# Patient Record
Sex: Female | Born: 1960 | Race: White | Hispanic: No | Marital: Married | Smoking: Never smoker
Health system: Southern US, Community
[De-identification: ages and names within clinical notes are randomized; demographics above are authoritative.]

## PROBLEM LIST (undated history)

## (undated) DIAGNOSIS — R232 Flushing: Secondary | ICD-10-CM

## (undated) DIAGNOSIS — R002 Palpitations: Secondary | ICD-10-CM

## (undated) DIAGNOSIS — M1990 Unspecified osteoarthritis, unspecified site: Secondary | ICD-10-CM

## (undated) HISTORY — DX: Flushing: R23.2

## (undated) HISTORY — DX: Unspecified osteoarthritis, unspecified site: M19.90

## (undated) HISTORY — PX: DILATION AND CURETTAGE OF UTERUS: SHX78

## (undated) HISTORY — DX: Palpitations: R00.2

## (undated) HISTORY — PX: OTHER SURGICAL HISTORY: SHX169

---

## 1999-07-24 ENCOUNTER — Encounter: Payer: Self-pay | Admitting: Obstetrics and Gynecology

## 1999-07-24 ENCOUNTER — Encounter: Admission: RE | Admit: 1999-07-24 | Discharge: 1999-07-24 | Payer: Self-pay | Admitting: Obstetrics and Gynecology

## 2000-07-30 ENCOUNTER — Encounter: Admission: RE | Admit: 2000-07-30 | Discharge: 2000-07-30 | Payer: Self-pay | Admitting: Obstetrics and Gynecology

## 2000-07-30 ENCOUNTER — Encounter: Payer: Self-pay | Admitting: Obstetrics and Gynecology

## 2001-08-19 ENCOUNTER — Encounter: Payer: Self-pay | Admitting: Obstetrics and Gynecology

## 2001-08-19 ENCOUNTER — Encounter: Admission: RE | Admit: 2001-08-19 | Discharge: 2001-08-19 | Payer: Self-pay | Admitting: Obstetrics and Gynecology

## 2002-08-21 ENCOUNTER — Encounter: Payer: Self-pay | Admitting: Obstetrics and Gynecology

## 2002-08-21 ENCOUNTER — Encounter: Admission: RE | Admit: 2002-08-21 | Discharge: 2002-08-21 | Payer: Self-pay | Admitting: Obstetrics and Gynecology

## 2004-06-30 ENCOUNTER — Encounter: Admission: RE | Admit: 2004-06-30 | Discharge: 2004-06-30 | Payer: Self-pay | Admitting: Unknown Physician Specialty

## 2005-09-06 ENCOUNTER — Encounter: Admission: RE | Admit: 2005-09-06 | Discharge: 2005-09-06 | Payer: Self-pay | Admitting: Unknown Physician Specialty

## 2006-04-23 ENCOUNTER — Ambulatory Visit (HOSPITAL_BASED_OUTPATIENT_CLINIC_OR_DEPARTMENT_OTHER): Admission: RE | Admit: 2006-04-23 | Discharge: 2006-04-23 | Payer: Self-pay | Admitting: Orthopaedic Surgery

## 2006-11-13 ENCOUNTER — Encounter: Admission: RE | Admit: 2006-11-13 | Discharge: 2006-11-13 | Payer: Self-pay | Admitting: Unknown Physician Specialty

## 2007-06-02 ENCOUNTER — Ambulatory Visit (HOSPITAL_COMMUNITY): Admission: RE | Admit: 2007-06-02 | Discharge: 2007-06-02 | Payer: Self-pay | Admitting: Optometry

## 2008-03-15 ENCOUNTER — Encounter: Admission: RE | Admit: 2008-03-15 | Discharge: 2008-03-15 | Payer: Self-pay | Admitting: Unknown Physician Specialty

## 2008-03-22 ENCOUNTER — Encounter: Admission: RE | Admit: 2008-03-22 | Discharge: 2008-03-22 | Payer: Self-pay | Admitting: Unknown Physician Specialty

## 2008-10-04 ENCOUNTER — Encounter: Admission: RE | Admit: 2008-10-04 | Discharge: 2008-10-04 | Payer: Self-pay | Admitting: Unknown Physician Specialty

## 2009-03-29 ENCOUNTER — Encounter: Admission: RE | Admit: 2009-03-29 | Discharge: 2009-03-29 | Payer: Self-pay | Admitting: Unknown Physician Specialty

## 2009-08-02 IMAGING — MG MM SCREEN MAMMOGRAM BILATERAL
4 series · 4 of 4 positions shown · non-contrast
Comparison: none

DG SCREEN MAMMOGRAM BILATERAL
Bilateral CC and MLO view(s) were taken.
Technologist: Gitte Juul Zawadi

DIGITAL SCREENING MAMMOGRAM WITH CAD:
There are scattered fibroglandular densities.  A possible mass is noted in the right breast.  Spot 
compression views and possibly sonography are recommended for further evaluation.  In the left 
breast, no masses or malignant type calcifications are identified.  Compared with prior studies.

[R CC]
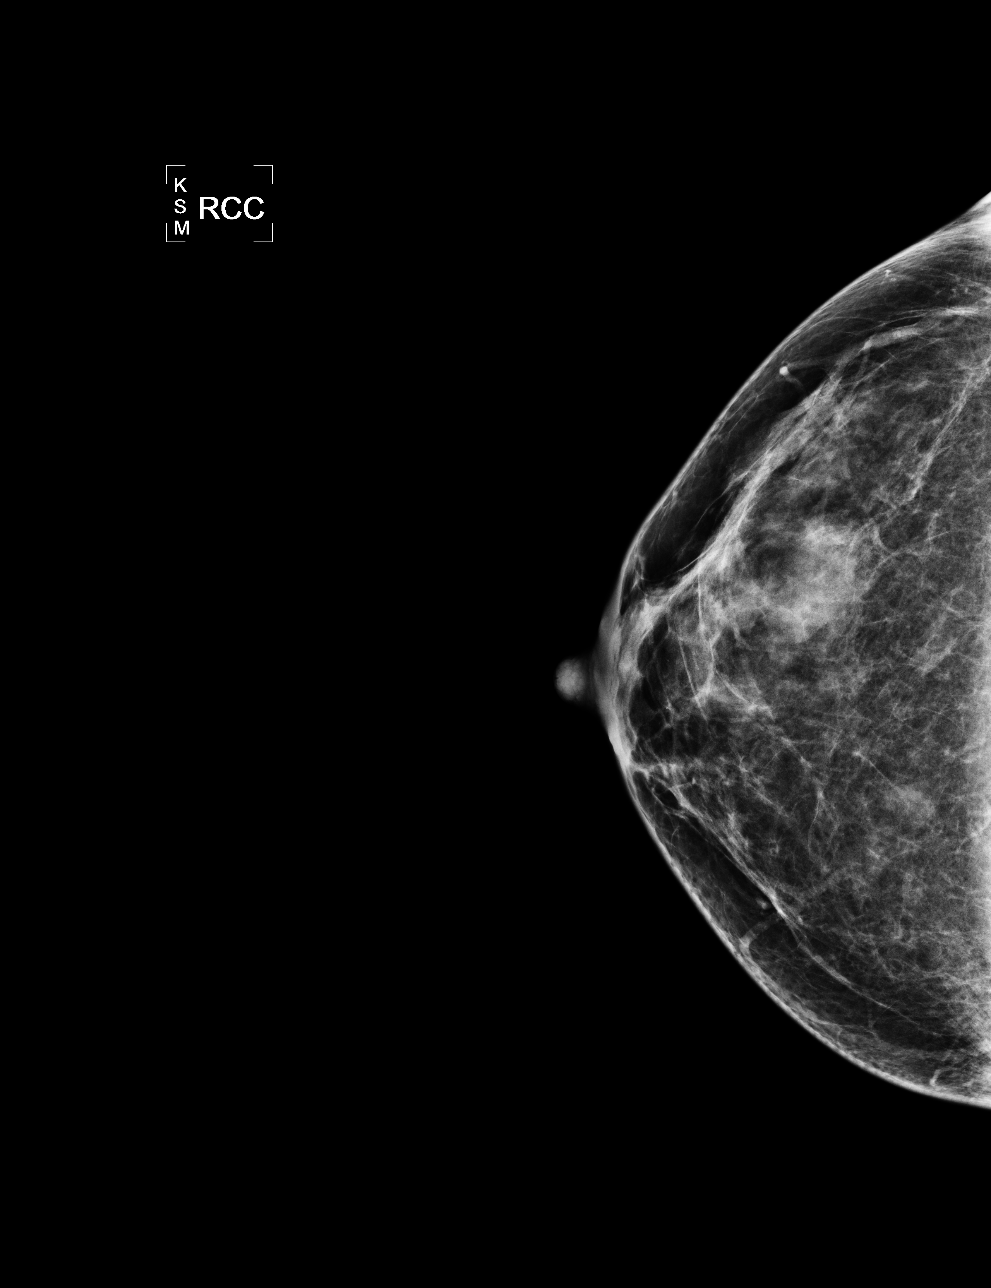

[L CC]
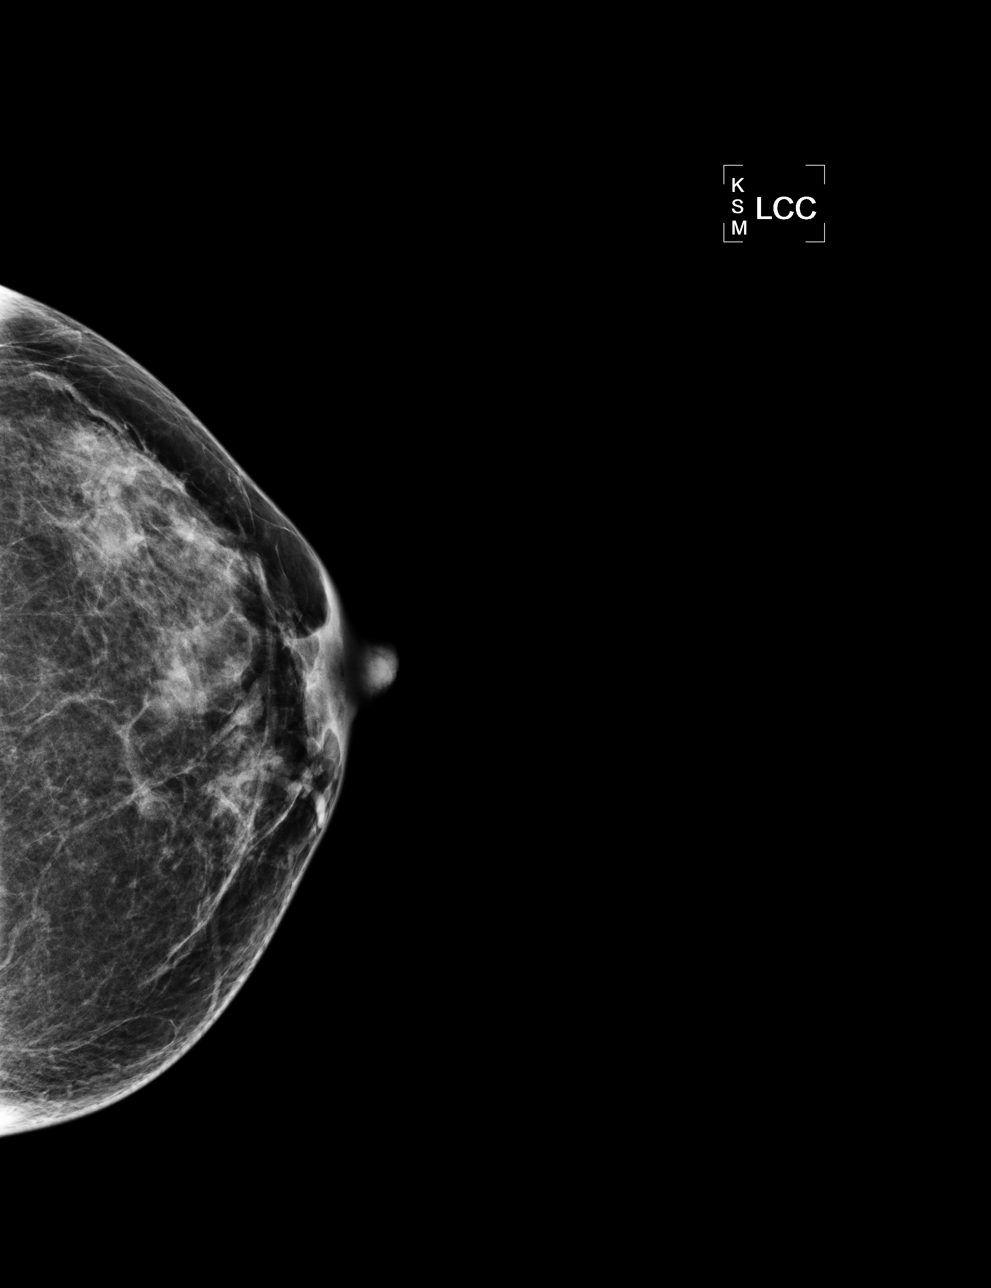

[L MLO]
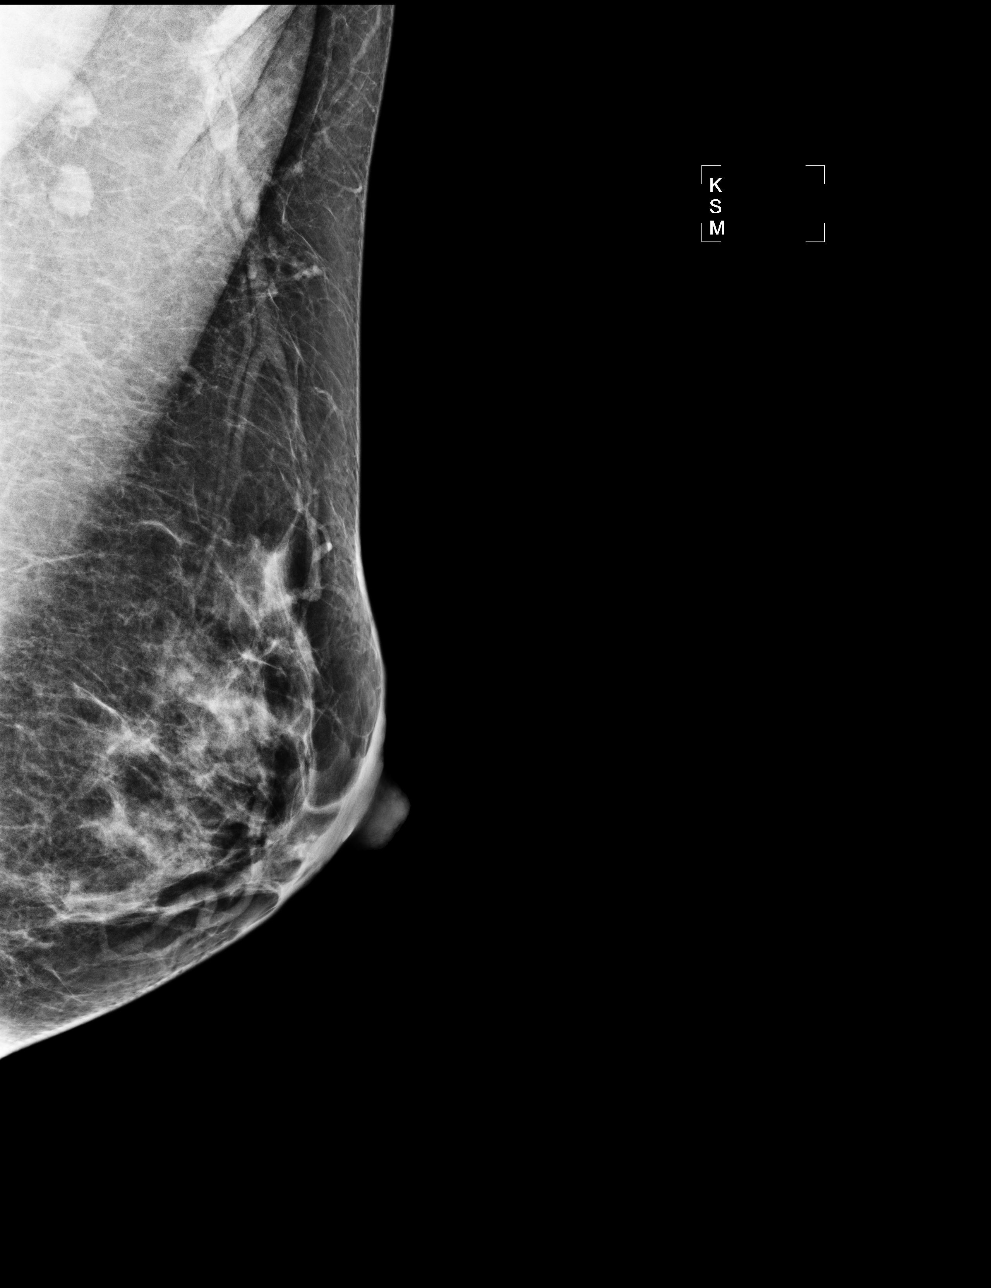

[R MLO]
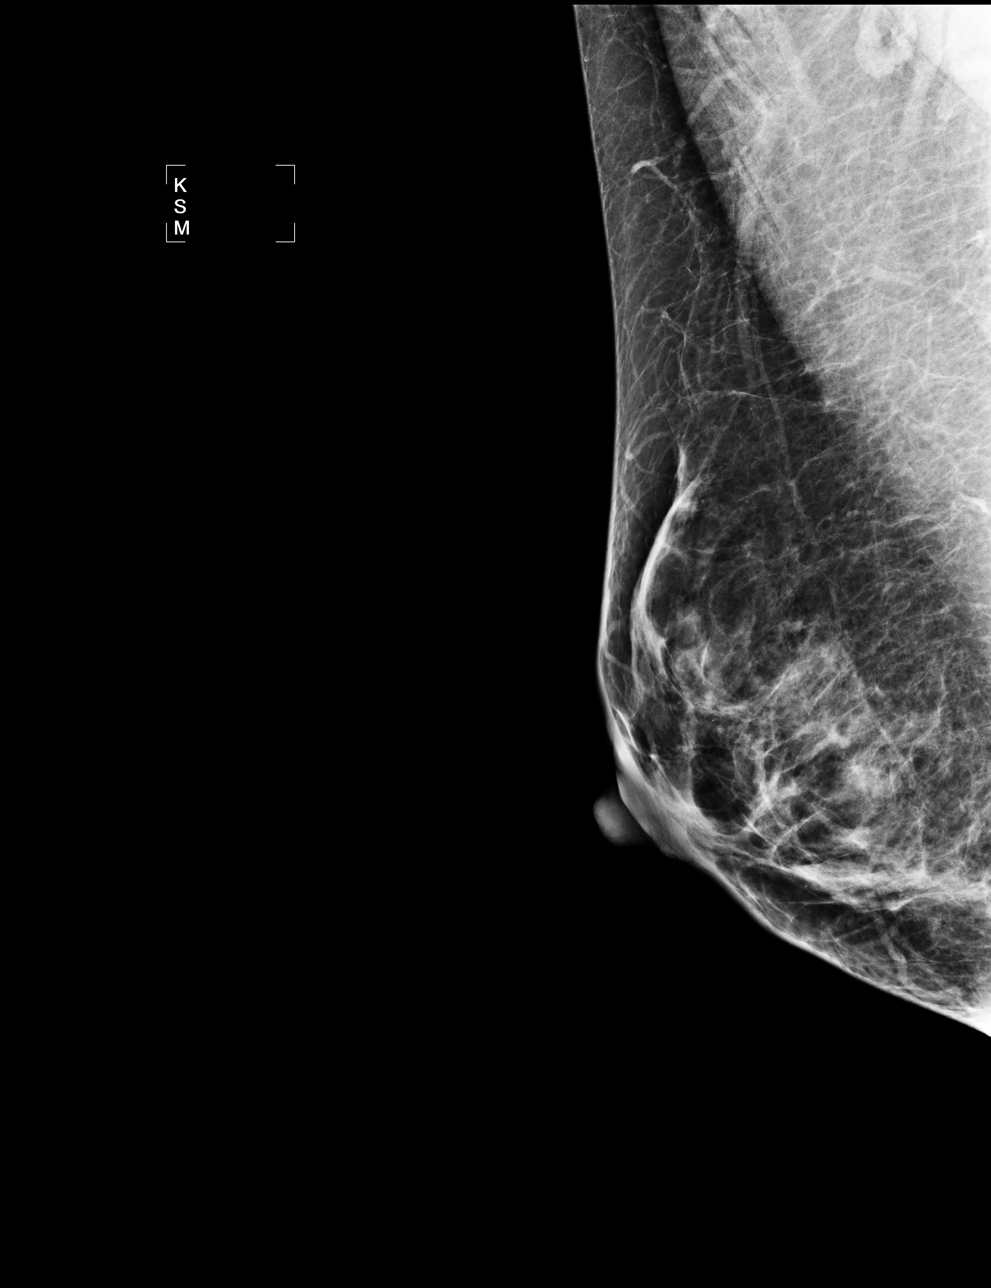

[4 of 4 positions shown; findings below may reference images not displayed]

IMPRESSION: Possible mass, right breast.  Additional evaluation is indicated.  The patient will be contacted 
for additional studies and a supplementary report will follow.  No specific mammographic evidence 
of malignancy, left breast.

ASSESSMENT: Need additional imaging evaluation and/or prior mammograms for comparison - BI-RADS 0

Further imaging of the right breast.
ANALYZED BY COMPUTER AIDED DETECTION. , THIS PROCEDURE WAS A DIGITAL MAMMOGRAM.

## 2010-05-19 ENCOUNTER — Encounter: Admission: RE | Admit: 2010-05-19 | Discharge: 2010-05-19 | Payer: Self-pay | Admitting: Unknown Physician Specialty

## 2010-07-20 ENCOUNTER — Encounter: Payer: Self-pay | Admitting: Internal Medicine

## 2010-08-07 DIAGNOSIS — D229 Melanocytic nevi, unspecified: Secondary | ICD-10-CM

## 2010-08-07 HISTORY — DX: Melanocytic nevi, unspecified: D22.9

## 2010-08-16 ENCOUNTER — Encounter (INDEPENDENT_AMBULATORY_CARE_PROVIDER_SITE_OTHER): Payer: Self-pay | Admitting: *Deleted

## 2010-08-18 ENCOUNTER — Ambulatory Visit: Payer: Self-pay | Admitting: Internal Medicine

## 2010-09-01 ENCOUNTER — Ambulatory Visit: Payer: Self-pay | Admitting: Internal Medicine

## 2010-09-25 ENCOUNTER — Encounter: Payer: Self-pay | Admitting: Optometry

## 2010-09-25 ENCOUNTER — Encounter: Payer: Self-pay | Admitting: Unknown Physician Specialty

## 2010-10-03 NOTE — Letter (Signed)
Summary: Pre Visit Letter Revised  [facility]  [address] [place], [place] [address]   Phone: [phone]  Fax: [phone]        07/20/2010 MRN: [id number]  Mariah Patel [address] [place], [place]  [address]             Procedure Date:  12-30 10am           Dr Marina Goodell   Welcome to the Gastroenterology Division at [facility].    You are scheduled to see a nurse for your pre-procedure visit on 08-18-10 at 10am on the 3rd floor at [facility], [address]  We ask that you try to arrive at our office 15 minutes prior to your appointment time to allow for check-in.  Please take a minute to review the attached form.  If you answer "Yes" to one or more of the questions on the first page, we ask that you call the person listed at your earliest opportunity.  If you answer "No" to all of the questions, please complete the rest of the form and bring it to your appointment.    Your nurse visit will consist of discussing your medical and surgical history, your immediate family medical history, and your medications.   If you are unable to list all of your medications on the form, please bring the medication bottles to your appointment and we will list them.  We will need to be aware of both prescribed and over the counter drugs.  We will need to know exact dosage information as well.    Please be prepared to read and sign documents such as consent forms, a financial agreement, and acknowledgement forms.  If necessary, and with your consent, a friend or relative is welcome to sit-in on the nurse visit with you.  Please bring your insurance card so that we may make a copy of it.  If your insurance requires a referral to see a specialist, please bring your referral form from your primary care physician.  No co-pay is required for this nurse visit.     If you cannot keep your appointment, please call [phone] to cancel or reschedule prior to your appointment date.   This allows [place] the opportunity to schedule an appointment for another patient in need of care.    Thank you for choosing [facility] for your medical needs.  We appreciate the opportunity to care for you.  Please visit [place] at our website  to learn more about our practice.  Sincerely, The Gastroenterology Division

## 2010-10-05 NOTE — Letter (Signed)
Summary: [facility] Instructions  [facility]  [address] [place], [place] [address]   Phone: [phone]  Fax: [phone]       Mariah Patel    Jan 09, 1961    MRN: [id number]        Procedure Day Dorna Bloom:  Farrell Ours  09/01/10     Arrival Time:  9:00AM     Procedure Time:  10:00AM     Location of Procedure:                    Juliann Pares _  [facility] (4th Floor)                      PREPARATION FOR COLONOSCOPY WITH MOVIPREP   Starting 5 days prior to your procedure 08/27/10 do not eat nuts, seeds, popcorn, corn, beans, peas,  salads, or any raw vegetables.  Do not take any fiber supplements (e.g. Metamucil, Citrucel, and Benefiber).  THE DAY BEFORE YOUR PROCEDURE         DATE: 08/31/10  DAY: THURSDAY  1.  Drink clear liquids the entire day-NO SOLID FOOD  2.  Do not drink anything colored red or purple.  Avoid juices with pulp.  No orange juice.  3.  Drink at least 64 oz. (8 glasses) of fluid/clear liquids during the day to prevent dehydration and help the prep work efficiently.  CLEAR LIQUIDS INCLUDE: Water Jello Ice Popsicles Tea (sugar ok, no milk/cream) Powdered fruit flavored drinks Coffee (sugar ok, no milk/cream) Gatorade Juice: apple, white grape, white cranberry  Lemonade Clear bullion, consomm, broth Carbonated beverages (any kind) Strained chicken noodle soup Hard Candy                             4.  In the morning, mix first dose of MoviPrep solution:    Empty 1 Pouch A and 1 Pouch B into the disposable container    Add lukewarm drinking water to the top line of the container. Mix to dissolve    Refrigerate (mixed solution should be used within 24 hrs)  5.  Begin drinking the prep at 5:00 p.m. The MoviPrep container is divided by 4 marks.   Every 15 minutes drink the solution down to the next mark (approximately 8 oz) until the full liter is complete.   6.  Follow completed prep with 16 oz of clear liquid of your choice (Nothing  red or purple).  Continue to drink clear liquids until bedtime.  7.  Before going to bed, mix second dose of MoviPrep solution:    Empty 1 Pouch A and 1 Pouch B into the disposable container    Add lukewarm drinking water to the top line of the container. Mix to dissolve    Refrigerate  THE DAY OF YOUR PROCEDURE      DATE: 09/01/10  DAY: FRIDAY  Beginning at 5:00AM (5 hours before procedure):         1. Every 15 minutes, drink the solution down to the next mark (approx 8 oz) until the full liter is complete.  2. Follow completed prep with 16 oz. of clear liquid of your choice.    3. You may drink clear liquids until 8:00AM (2 HOURS BEFORE PROCEDURE).   MEDICATION INSTRUCTIONS  Unless otherwise instructed, you should take regular prescription medications with a small sip of water   as early as possible the morning of your  procedure.           OTHER INSTRUCTIONS  You will need a responsible adult at least 51 years of age to accompany you and drive you home.   This person must remain in the waiting room during your procedure.  Wear loose fitting clothing that is easily removed.  Leave jewelry and other valuables at home.  However, you may wish to bring a book to read or  an iPod/MP3 player to listen to music as you wait for your procedure to start.  Remove all body piercing jewelry and leave at home.  Total time from sign-in until discharge is approximately 2-3 hours.  You should go home directly after your procedure and rest.  You can resume normal activities the  day after your procedure.  The day of your procedure you should not:   Drive   Make legal decisions   Operate machinery   Drink alcohol   Return to work  You will receive specific instructions about eating, activities and medications before you leave.    The above instructions have been reviewed and explained to me by   Sherren Kerns RN  August 18, 2010 10:13 AM    I fully understand and  can verbalize these instructions _____________________________ Date _________

## 2010-10-05 NOTE — Miscellaneous (Signed)
Summary: previsit prep/rm  Clinical Lists Changes  Medications: Added new medication of MOVIPREP 100 GM  SOLR (PEG-KCL-NACL-NASULF-NA ASC-C) As per prep instructions. - Signed Rx of MOVIPREP 100 GM  SOLR (PEG-KCL-NACL-NASULF-NA ASC-C) As per prep instructions.;  #1 x 0;  Signed;  Entered by: Sherren Kerns RN;  Authorized by: Hilarie Fredrickson MD;  Method used: Electronically to [facility] [address], [place], [place], [place]  [address], Ph: [phone], Fax: [phone] Allergies: Added new allergy or adverse reaction of * SHELLFISH Observations: Added new observation of ALLERGY REV: Done (08/18/2010 9:56) Added new observation of NKA: F (08/18/2010 9:56)    Prescriptions: MOVIPREP 100 GM  SOLR (PEG-KCL-NACL-NASULF-NA ASC-C) As per prep instructions.  #1 x 0   Entered by:   Sherren Kerns RN   Authorized by:   Hilarie Fredrickson MD   Signed by:   Sherren Kerns RN on 08/18/2010   Method used:   Electronically to        [facility] (retail)       [address]       [place], [place]  [address]       Ph: [phone]       Fax: [phone]   RxID:   [id number]

## 2011-01-19 NOTE — Op Note (Signed)
NAME:  Mariah Patel, Mariah Patel                    ACCOUNT NO.:  [id number]   MEDICAL RECORD NO.:  [id number]          PATIENT TYPE:  AMB   LOCATION:  DSC                          FACILITY:  [facility]   PHYSICIAN:  Lubertha Basque. Dalldorf, M.D.DATE OF BIRTH:  [birth year]   DATE OF PROCEDURE:  04/23/2006  DATE OF DISCHARGE:                                 OPERATIVE REPORT   PREOPERATIVE DIAGNOSIS:  Right index mucous cyst.   POSTOPERATIVE DIAGNOSIS:  Right index mucous cyst.   PROCEDURE:  Excision of mucous cyst right index finger.   ANESTHESIA:  Bier block and MAC.   ATTENDING SURGEON:  Lubertha Basque. Jerl Santos, M.D.   ASSISTANT:  Lindwood Qua, P.A.-C.   INDICATIONS FOR PROCEDURE:  The patient is a 50 year old woman with a many  month history of a large cyst at the DIP joint of her right dominant index  finger.  This has become problematic in that it interferes with the use of  her hand and is cosmetically displeasing as well.  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  She has a presumed mucous cyst  related to the underlying degenerative changes.  She is offered excision of  the cyst and irrigation of the joint below.  Informed operative consent was  obtained after a discussion of the possible complications of reaction to  anesthesia, infection, neurovascular injury, and recurrence.   SUMMARY OF FINDINGS:  Under forearm Bier block and some sedation, through a  longitudinal incision, a gelatinous filled mucous cyst was removed.  This  was almost 1 cm in its greatest diameter.  This tracked directly down to the  DIP joint and was along the ulnar aspect of this joint.  We did remove some  loose cartilage from this joint with the irrigation and a small spur, as  well, with a rongeur.  I used fluoroscopy to confirm adequate resection of  the spur and read these views myself.  [name] assisted throughout and  was invaluable in that he helped retract as I removed the spur and he  also  closed simultaneously to help minimize OR time.   DESCRIPTION OF PROCEDURE:  The patient was taken to the operating suite  where a Bier block was applied to the level of her forearm.  She was also  given some IV sedation.  She was positioned supine and prepped and draped in  a normal sterile fashion.  After administration of IV Kefzol, the right arm  was addressed with a small incision over the cyst.  Dissection was carried  down to a gelatinous filled cyst.  The skin was very thin over this and it  was fairly difficult to dissect between the layer of the cyst and the skin,  but this was accomplished without violating the skin.  The cyst was directly  over the ulnar aspect of the DIP joint.  Once the cyst was excised, a small  fleck of cartilage emanated from the DIP joint and was removed.  I then  irrigated this joint with saline using a small needle  on a syringe.  I used  fluoroscopy to examine the joint and did see one small spur dorsally which I  excised actually off the middle phalanx.  The joint was again irrigated as  was the wound.  We then reapproximated the skin with nylon.  The tourniquet  was deflated and her fingertip became pink and warm immediately.  Marcaine  block was applied to the digit without epinephrine.  We then dressed this  with Adaptic and dry gauze dressing with a loose Coban wrap.  Estimated  blood loss and interoperative fluids can be obtained from anesthesia records  as can an accurate tourniquet time.   DISPOSITION:  The patient was taken to the recovery room in stable addition.  She was to go home the same day and to follow up in the office in less than  a week.  I will contact her by phone tonight.      Lubertha Basque Jerl Santos, M.D.  Electronically Signed     PGD/MEDQ  D:  04/23/2006  T:  04/23/2006  Job:  [id number]

## 2011-12-25 ENCOUNTER — Encounter: Payer: Self-pay | Admitting: Cardiovascular Disease

## 2011-12-25 ENCOUNTER — Encounter: Payer: Self-pay | Admitting: *Deleted

## 2011-12-26 ENCOUNTER — Encounter: Payer: Self-pay | Admitting: Cardiovascular Disease

## 2011-12-26 ENCOUNTER — Ambulatory Visit (INDEPENDENT_AMBULATORY_CARE_PROVIDER_SITE_OTHER): Payer: BC Managed Care – PPO | Admitting: Cardiovascular Disease

## 2011-12-26 VITALS — BP 120/60 | HR 60 | Resp 18 | Ht 66.0 in | Wt 145.8 lb

## 2011-12-26 DIAGNOSIS — M129 Arthropathy, unspecified: Secondary | ICD-10-CM

## 2011-12-26 DIAGNOSIS — Z780 Asymptomatic menopausal state: Secondary | ICD-10-CM | POA: Insufficient documentation

## 2011-12-26 DIAGNOSIS — R002 Palpitations: Secondary | ICD-10-CM

## 2011-12-26 DIAGNOSIS — Z90710 Acquired absence of both cervix and uterus: Secondary | ICD-10-CM

## 2011-12-26 DIAGNOSIS — M1990 Unspecified osteoarthritis, unspecified site: Secondary | ICD-10-CM

## 2011-12-26 DIAGNOSIS — N951 Menopausal and female climacteric states: Secondary | ICD-10-CM

## 2011-12-26 NOTE — Assessment & Plan Note (Signed)
Benign Too self limited for monitor.  Echo to R/O MVP or structural heart disease

## 2011-12-26 NOTE — Progress Notes (Signed)
Patient ID: Mariah Patel, female   DOB: 1961-05-16, 51 y.o.   MRN: [id number] 51 yo referred by Dr Renaldo Fiddler for palpitations.  Has had them for over a year.  ? Related to menapause or hormonal changes.  Previous hysterectomy 92 not on replacement.  Usually at night after changing position.  Self limited lasting less than 30 seconds.  No palpitations during exercise.  Does hiking, and zumba with no difficulty.  Revied ECG;s from primary Dr Elease Hashimoto 2011 and 2012 both normal with NSR and no pre-excitation.  Some cafeinne but not excessive.  Coffee in am and Dr Reino Kent occasionally.  No other stimulants.  Not sure if her thyroid has been checked.  Not anemic.    ROS: Denies fever, malais, weight loss, blurry vision, decreased visual acuity, cough, sputum, SOB, hemoptysis, pleuritic pain, palpitaitons, heartburn, abdominal pain, melena, lower extremity edema, claudication, or rash.  All other systems reviewed and negative   General: Affect appropriate Healthy:  appears stated age HEENT: normal Neck supple with no adenopathy JVP normal no bruits no thyromegaly Lungs clear with no wheezing and good diaphragmatic motion Heart:  S1/S2 no murmur,rub, gallop or click PMI normal Abdomen: benighn, BS positve, no tenderness, no AAA no bruit.  No HSM or HJR Distal pulses intact with no bruits No edema Neuro non-focal Skin warm and dry No muscular weakness  Medications Current Outpatient Prescriptions  Medication Sig Dispense Refill  . Ascorbic Acid (VITAMIN C) 1000 MG tablet Take 1,000 mg by mouth daily.      . brimonidine (ALPHAGAN P) 0.1 % SOLN Place 1 drop into both eyes daily.      . Calcium Carbonate-Vitamin D (CALCIUM + D PO) Take 1 tablet by mouth 2 (two) times daily.      . Multiple Vitamin (MULTIVITAMIN) capsule Take 1 capsule by mouth daily.      . Naproxen Sodium (ALEVE) 220 MG CAPS Take by mouth as needed.        Allergies Shellfish allergy  Family History: Family History  Problem  Relation Age of Onset  . Heart disease    . Arthritis    . Diabetes      Social History: History   Social History  . Marital Status: Married    Spouse Name: N/A    Number of Children: N/A  . Years of Education: N/A   Occupational History  . Not on file.   Social History Main Topics  . Smoking status: Never Smoker   . Smokeless tobacco: Not on file  . Alcohol Use: No  . Drug Use: No  . Sexually Active: Not on file   Other Topics Concern  . Not on file   Social History Narrative  . No narrative on file    Electrocardiogram:  NSR rate 57  Normal ECG RAD from body habitus  Assessment and Plan

## 2011-12-26 NOTE — Assessment & Plan Note (Signed)
PRN aleve  Avoid Lyster-2 inhibitors

## 2011-12-26 NOTE — Patient Instructions (Signed)
Your physician has requested that you have an echocardiogram. Echocardiography is a painless test that uses sound waves to create images of your heart. It provides your doctor with information about the size and shape of your heart and how well your heart's chambers and valves are working. This procedure takes approximately one hour. There are no restrictions for this procedure.  Your physician recommends that you schedule a follow-up appointment as needed with Dr. Nishan   

## 2011-12-26 NOTE — Assessment & Plan Note (Signed)
F/U Dr Renaldo Fiddler and can consider hormone replacement.

## 2012-01-01 ENCOUNTER — Institutional Professional Consult (permissible substitution): Payer: Self-pay | Admitting: Cardiovascular Disease

## 2012-01-03 ENCOUNTER — Other Ambulatory Visit: Payer: Self-pay

## 2012-01-03 ENCOUNTER — Ambulatory Visit (HOSPITAL_COMMUNITY): Payer: BC Managed Care – PPO | Attending: Cardiovascular Disease

## 2012-01-03 DIAGNOSIS — R002 Palpitations: Secondary | ICD-10-CM

## 2012-01-03 DIAGNOSIS — I079 Rheumatic tricuspid valve disease, unspecified: Secondary | ICD-10-CM | POA: Insufficient documentation

## 2019-10-18 ENCOUNTER — Ambulatory Visit: Payer: Self-pay | Attending: Internal Medicine

## 2019-10-18 DIAGNOSIS — Z23 Encounter for immunization: Secondary | ICD-10-CM

## 2019-10-18 NOTE — Progress Notes (Signed)
   Covid-19 Vaccination Clinic  Name:  Mariah Patel    MRN: [id number] DOB: 22-Sep-1960  10/18/2019  Ms. Mariah Patel was observed post Covid-19 immunization for 15 minutes without incidence. She was provided with Vaccine Information Sheet and instruction to access the V-Safe system.   Ms. Mariah Patel was instructed to call 911 with any severe reactions post vaccine: [name] Kitchen Difficulty breathing  . Swelling of your face and throat  . A fast heartbeat  . A bad rash all over your body  . Dizziness and weakness    Immunizations Administered    Name Date Dose VIS Date Route   Pfizer COVID-19 Vaccine 10/18/2019  3:01 PM 0.3 mL 08/14/2019 Intramuscular   Manufacturer: [place]   Lot: [id number]   [place]: [id number]

## 2019-11-09 ENCOUNTER — Ambulatory Visit: Payer: Self-pay | Attending: Internal Medicine

## 2019-11-09 DIAGNOSIS — Z23 Encounter for immunization: Secondary | ICD-10-CM | POA: Insufficient documentation

## 2019-11-09 NOTE — Progress Notes (Signed)
   Covid-19 Vaccination Clinic  Name:  Mariah Patel    MRN: [id number] DOB: 1961-08-10  11/09/2019  [name] was observed post Covid-19 immunization for 15 minutes without incident. She was provided with Vaccine Information Sheet and instruction to access the V-Safe system.   [name] was instructed to call 911 with any severe reactions post vaccine: [name] Kitchen Difficulty breathing  . Swelling of face and throat  . A fast heartbeat  . A bad rash all over body  . Dizziness and weakness   Immunizations Administered    Name Date Dose VIS Date Route   Pfizer COVID-19 Vaccine 11/09/2019 12:24 PM 0.3 mL 08/14/2019 Intramuscular   Manufacturer: [place]   Lot: [id number]   [place]: [id number]

## 2021-09-25 ENCOUNTER — Other Ambulatory Visit: Payer: Self-pay | Admitting: Obstetrics and Gynecology

## 2021-09-25 DIAGNOSIS — R928 Other abnormal and inconclusive findings on diagnostic imaging of breast: Secondary | ICD-10-CM

## 2021-10-04 ENCOUNTER — Ambulatory Visit
Admission: RE | Admit: 2021-10-04 | Discharge: 2021-10-04 | Disposition: A | Discharge status: Home / Self Care | Payer: No Typology Code available for payment source | Source: Ambulatory Visit | Attending: Obstetrics and Gynecology | Admitting: Obstetrics and Gynecology

## 2021-10-04 ENCOUNTER — Ambulatory Visit: Payer: Self-pay

## 2021-10-04 DIAGNOSIS — R928 Other abnormal and inconclusive findings on diagnostic imaging of breast: Secondary | ICD-10-CM
